# Patient Record
Sex: Female | Born: 1985 | Race: White | Hispanic: No | Marital: Single | Smoking: Current every day smoker
Health system: Southern US, Community
[De-identification: ages and names within clinical notes are randomized; demographics above are authoritative.]

## PROBLEM LIST (undated history)

## (undated) DIAGNOSIS — N200 Calculus of kidney: Secondary | ICD-10-CM

---

## 2020-11-06 ENCOUNTER — Emergency Department (HOSPITAL_COMMUNITY): Payer: Medicaid Other

## 2020-11-06 ENCOUNTER — Emergency Department (HOSPITAL_COMMUNITY)
Admission: EM | Admit: 2020-11-06 | Discharge: 2020-11-06 | Disposition: A | Discharge status: Home / Self Care | Payer: Medicaid Other | Attending: Emergency Medicine | Admitting: Emergency Medicine

## 2020-11-06 ENCOUNTER — Other Ambulatory Visit: Payer: Self-pay

## 2020-11-06 ENCOUNTER — Encounter (HOSPITAL_COMMUNITY): Payer: Self-pay

## 2020-11-06 DIAGNOSIS — S91301A Unspecified open wound, right foot, initial encounter: Secondary | ICD-10-CM | POA: Insufficient documentation

## 2020-11-06 DIAGNOSIS — R0781 Pleurodynia: Secondary | ICD-10-CM | POA: Diagnosis not present

## 2020-11-06 DIAGNOSIS — S99921A Unspecified injury of right foot, initial encounter: Secondary | ICD-10-CM | POA: Diagnosis present

## 2020-11-06 DIAGNOSIS — F17210 Nicotine dependence, cigarettes, uncomplicated: Secondary | ICD-10-CM | POA: Diagnosis not present

## 2020-11-06 DIAGNOSIS — S0083XA Contusion of other part of head, initial encounter: Secondary | ICD-10-CM | POA: Diagnosis not present

## 2020-11-06 DIAGNOSIS — R52 Pain, unspecified: Secondary | ICD-10-CM

## 2020-11-06 DIAGNOSIS — M79672 Pain in left foot: Secondary | ICD-10-CM | POA: Insufficient documentation

## 2020-11-06 HISTORY — DX: Calculus of kidney: N20.0

## 2020-11-06 MED ORDER — IBUPROFEN 400 MG PO TABS
600.0000 mg | ORAL_TABLET | Freq: Once | ORAL | Status: AC
  Administered 2020-11-06: 600 mg via ORAL
  Filled 2020-11-06: qty 1

## 2020-11-06 MED ORDER — LEVOFLOXACIN 750 MG PO TABS
750.0000 mg | ORAL_TABLET | Freq: Once | ORAL | Status: AC
  Administered 2020-11-06: 750 mg via ORAL
  Filled 2020-11-06: qty 1

## 2020-11-06 MED ORDER — LEVOFLOXACIN 750 MG PO TABS: 750.0000 mg | ORAL_TABLET | Freq: Every day | ORAL | 0 refills | Status: AC
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
## 2020-11-06 NOTE — ED Notes (Signed)
Patient verbalizes understanding of discharge instructions. Prescriptions reviewed. Opportunity for questioning and answers were provided. Armband removed by staff, pt discharged from ED ambulatory. ° °

## 2020-11-06 NOTE — Discharge Instructions (Addendum)
Follow up with your doctor in the office.  Return for redness, fever
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
